# Patient Record
Sex: Female | Born: 1980 | Race: White | Hispanic: No | Marital: Married | Smoking: Never smoker
Health system: Southern US, Community
[De-identification: ages and names within clinical notes are randomized; demographics above are authoritative.]

## PROBLEM LIST (undated history)

## (undated) DIAGNOSIS — O09519 Supervision of elderly primigravida, unspecified trimester: Secondary | ICD-10-CM

## (undated) DIAGNOSIS — A609 Anogenital herpesviral infection, unspecified: Secondary | ICD-10-CM

## (undated) DIAGNOSIS — R87629 Unspecified abnormal cytological findings in specimens from vagina: Secondary | ICD-10-CM

## (undated) HISTORY — PX: WISDOM TOOTH EXTRACTION: SHX21

## (undated) HISTORY — PX: GYNECOLOGIC CRYOSURGERY: SHX857

---
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
## 2001-04-18 ENCOUNTER — Encounter: Payer: Self-pay | Admitting: Emergency Medicine

## 2001-04-18 ENCOUNTER — Emergency Department (HOSPITAL_COMMUNITY): Admission: EM | Admit: 2001-04-18 | Discharge: 2001-04-18 | Payer: Self-pay | Admitting: Emergency Medicine

## 2004-09-12 ENCOUNTER — Other Ambulatory Visit: Admission: RE | Admit: 2004-09-12 | Discharge: 2004-09-12 | Payer: Self-pay | Admitting: Obstetrics and Gynecology

## 2017-04-14 LAB — OB RESULTS CONSOLE RPR: RPR: NONREACTIVE

## 2017-04-14 LAB — OB RESULTS CONSOLE RUBELLA ANTIBODY, IGM: Rubella: IMMUNE

## 2017-04-14 LAB — OB RESULTS CONSOLE HEPATITIS B SURFACE ANTIGEN: HEP B S AG: NEGATIVE

## 2017-04-14 LAB — OB RESULTS CONSOLE ABO/RH: RH TYPE: POSITIVE

## 2017-04-14 LAB — OB RESULTS CONSOLE HIV ANTIBODY (ROUTINE TESTING): HIV: NONREACTIVE

## 2017-04-22 LAB — OB RESULTS CONSOLE GC/CHLAMYDIA
CHLAMYDIA, DNA PROBE: NEGATIVE
Gonorrhea: NEGATIVE

## 2017-11-01 ENCOUNTER — Encounter (HOSPITAL_COMMUNITY): Payer: Self-pay | Admitting: *Deleted

## 2017-11-02 ENCOUNTER — Other Ambulatory Visit (HOSPITAL_COMMUNITY): Payer: Self-pay | Admitting: Obstetrics and Gynecology

## 2017-11-02 ENCOUNTER — Ambulatory Visit (HOSPITAL_COMMUNITY)
Admission: RE | Admit: 2017-11-02 | Discharge: 2017-11-02 | Disposition: A | Discharge status: Home / Self Care | Payer: Managed Care, Other (non HMO) | Source: Ambulatory Visit | Attending: Obstetrics and Gynecology | Admitting: Obstetrics and Gynecology

## 2017-11-02 DIAGNOSIS — R609 Edema, unspecified: Secondary | ICD-10-CM | POA: Insufficient documentation

## 2017-11-02 NOTE — Progress Notes (Signed)
*  Preliminary Results* Right lower extremity venous duplex completed. Right lower extremity is negative for deep vein thrombosis. There is no evidence of right Baker's cyst.  11/02/2017 4:58 PM  Gertie FeyMichelle Daizy Morse, MHA, RVT, RDCS, RDMS

## 2017-11-08 ENCOUNTER — Encounter (HOSPITAL_COMMUNITY)
Admission: RE | Admit: 2017-11-08 | Discharge: 2017-11-08 | Disposition: A | Discharge status: Home / Self Care | Payer: Managed Care, Other (non HMO) | Source: Ambulatory Visit | Attending: Obstetrics and Gynecology | Admitting: Obstetrics and Gynecology

## 2017-11-08 HISTORY — DX: Supervision of elderly primigravida, unspecified trimester: O09.519

## 2017-11-08 HISTORY — DX: Unspecified abnormal cytological findings in specimens from vagina: R87.629

## 2017-11-08 HISTORY — DX: Anogenital herpesviral infection, unspecified: A60.9

## 2017-11-08 LAB — CBC
HCT: 36.4 % (ref 36.0–46.0)
Hemoglobin: 12.1 g/dL (ref 12.0–15.0)
MCH: 31.8 pg (ref 26.0–34.0)
MCHC: 33.2 g/dL (ref 30.0–36.0)
MCV: 95.5 fL (ref 78.0–100.0)
PLATELETS: 189 10*3/uL (ref 150–400)
RBC: 3.81 MIL/uL — AB (ref 3.87–5.11)
RDW: 14.3 % (ref 11.5–15.5)
WBC: 9.9 10*3/uL (ref 4.0–10.5)

## 2017-11-08 LAB — TYPE AND SCREEN
ABO/RH(D): O POS
Antibody Screen: NEGATIVE

## 2017-11-08 LAB — ABO/RH: ABO/RH(D): O POS

## 2017-11-08 NOTE — Patient Instructions (Signed)
Sydney Morse  11/08/2017   Your procedure is scheduled on:  11/09/2017  Enter through the [facility]tr[facility] at 1100 AM.  Pick up the phone at the desk and dial [phone]  Call this number if you have problems the morning of surgery:[phone]  Remember:   Do not eat food:(After Midnight) Desps de medianoche.  Do not drink clear liquids: (After Midnight) Desps de medianoche.  Take these medicines the morning of surgery with A SIP OF WATER: none   Do not wear jewelry, make-up or nail polish.  Do not wear lotions, powders, or perfumes. Do not wear deodorant.  Do not shave 48 hours prior to surgery.  Do not bring valuables to the hospital.  [name] is not   responsible for any belongings or valuables brought to the hospital.  Contacts, dentures or bridgework may not be worn into surgery.  Leave suitcase in the car. After surgery it may be brought to your room.  For patients admitted to the hospital, checkout time is 11:00 AM the day of              discharge.    N/A   Please read over the following fact sheets that you were given:   Surgical Site Infection Prevention

## 2017-11-09 ENCOUNTER — Inpatient Hospital Stay (HOSPITAL_COMMUNITY): Payer: Managed Care, Other (non HMO) | Admitting: Anesthesiology

## 2017-11-09 ENCOUNTER — Encounter (HOSPITAL_COMMUNITY): Payer: Self-pay | Admitting: *Deleted

## 2017-11-09 ENCOUNTER — Encounter (HOSPITAL_COMMUNITY)
Admission: AD | Disposition: A | Discharge status: Home / Self Care | Payer: Self-pay | Source: Home / Self Care | Attending: Obstetrics and Gynecology

## 2017-11-09 ENCOUNTER — Inpatient Hospital Stay (HOSPITAL_COMMUNITY)
Admission: AD | Admit: 2017-11-09 | Discharge: 2017-11-11 | DRG: 787 | Disposition: A | Discharge status: Home / Self Care | Payer: Managed Care, Other (non HMO) | Attending: Obstetrics and Gynecology | Admitting: Obstetrics and Gynecology

## 2017-11-09 ENCOUNTER — Inpatient Hospital Stay (HOSPITAL_COMMUNITY)
Admission: AD | Admit: 2017-11-09 | Disposition: A | Discharge status: Home / Self Care | Payer: Managed Care, Other (non HMO) | Source: Home / Self Care | Admitting: Obstetrics and Gynecology

## 2017-11-09 ENCOUNTER — Other Ambulatory Visit: Payer: Self-pay

## 2017-11-09 DIAGNOSIS — O9832 Other infections with a predominantly sexual mode of transmission complicating childbirth: Secondary | ICD-10-CM | POA: Diagnosis present

## 2017-11-09 DIAGNOSIS — A6000 Herpesviral infection of urogenital system, unspecified: Secondary | ICD-10-CM | POA: Diagnosis present

## 2017-11-09 DIAGNOSIS — O321XX0 Maternal care for breech presentation, not applicable or unspecified: Principal | ICD-10-CM | POA: Diagnosis present

## 2017-11-09 DIAGNOSIS — Z3A39 39 weeks gestation of pregnancy: Secondary | ICD-10-CM | POA: Diagnosis not present

## 2017-11-09 LAB — RPR: RPR Ser Ql: NONREACTIVE

## 2017-11-09 SURGERY — Surgical Case
Anesthesia: Spinal

## 2017-11-09 MED ORDER — PHENYLEPHRINE 8 MG IN D5W 100 ML (0.08MG/ML) PREMIX OPTIME
INJECTION | INTRAVENOUS | Status: AC
  Filled 2017-11-09: qty 100

## 2017-11-09 MED ORDER — DEXAMETHASONE SODIUM PHOSPHATE 4 MG/ML IJ SOLN
INTRAMUSCULAR | Status: AC
  Filled 2017-11-09: qty 1

## 2017-11-09 MED ORDER — ACETAMINOPHEN 10 MG/ML IV SOLN
1000.0000 mg | Freq: Once | INTRAVENOUS | Status: DC | PRN
  Administered 2017-11-09: 1000 mg via INTRAVENOUS

## 2017-11-09 MED ORDER — ONDANSETRON HCL 4 MG/2ML IJ SOLN
INTRAMUSCULAR | Status: DC | PRN
  Administered 2017-11-09: 4 mg via INTRAVENOUS

## 2017-11-09 MED ORDER — BUPIVACAINE IN DEXTROSE 0.75-8.25 % IT SOLN
INTRATHECAL | Status: DC | PRN
  Administered 2017-11-09: 1.6 mL via INTRATHECAL

## 2017-11-09 MED ORDER — ACETAMINOPHEN 10 MG/ML IV SOLN
INTRAVENOUS | Status: AC
  Filled 2017-11-09: qty 100

## 2017-11-09 MED ORDER — HYDROMORPHONE HCL 1 MG/ML IJ SOLN
0.2500 mg | INTRAMUSCULAR | Status: DC | PRN
  Administered 2017-11-09 (×2): 0.5 mg via INTRAVENOUS

## 2017-11-09 MED ORDER — ONDANSETRON HCL 4 MG/2ML IJ SOLN
INTRAMUSCULAR | Status: AC
  Filled 2017-11-09: qty 2

## 2017-11-09 MED ORDER — OXYTOCIN 40 UNITS IN LACTATED RINGERS INFUSION - SIMPLE MED: 2.5000 [IU]/h | INTRAVENOUS | Status: AC

## 2017-11-09 MED ORDER — DIPHENHYDRAMINE HCL 25 MG PO CAPS: 25.0000 mg | ORAL_CAPSULE | Freq: Four times a day (QID) | ORAL | Status: DC | PRN

## 2017-11-09 MED ORDER — MENTHOL 3 MG MT LOZG: 1.0000 | LOZENGE | OROMUCOSAL | Status: DC | PRN

## 2017-11-09 MED ORDER — PROMETHAZINE HCL 25 MG/ML IJ SOLN: 6.2500 mg | INTRAMUSCULAR | Status: DC | PRN

## 2017-11-09 MED ORDER — MEASLES, MUMPS & RUBELLA VAC ~~LOC~~ INJ: 0.5000 mL | INJECTION | Freq: Once | SUBCUTANEOUS | Status: DC

## 2017-11-09 MED ORDER — DIBUCAINE 1 % RE OINT: 1.0000 "application " | TOPICAL_OINTMENT | RECTAL | Status: DC | PRN

## 2017-11-09 MED ORDER — SIMETHICONE 80 MG PO CHEW
80.0000 mg | CHEWABLE_TABLET | ORAL | Status: DC
  Administered 2017-11-09 – 2017-11-11 (×2): 80 mg via ORAL
  Filled 2017-11-09 (×2): qty 1

## 2017-11-09 MED ORDER — WITCH HAZEL-GLYCERIN EX PADS: 1.0000 "application " | MEDICATED_PAD | CUTANEOUS | Status: DC | PRN

## 2017-11-09 MED ORDER — LACTATED RINGERS IV SOLN
INTRAVENOUS | Status: DC | PRN
  Administered 2017-11-09: 10:00:00 via INTRAVENOUS

## 2017-11-09 MED ORDER — ACETAMINOPHEN 325 MG PO TABS
650.0000 mg | ORAL_TABLET | ORAL | Status: DC | PRN
  Administered 2017-11-09 – 2017-11-10 (×3): 650 mg via ORAL
  Filled 2017-11-09 (×4): qty 2

## 2017-11-09 MED ORDER — DEXAMETHASONE SODIUM PHOSPHATE 4 MG/ML IJ SOLN
INTRAMUSCULAR | Status: DC | PRN
  Administered 2017-11-09: 4 mg via INTRAVENOUS

## 2017-11-09 MED ORDER — HYDROMORPHONE HCL 1 MG/ML IJ SOLN
1.0000 mg | INTRAMUSCULAR | Status: DC | PRN
  Administered 2017-11-09: 1 mg via INTRAVENOUS
  Filled 2017-11-09: qty 1

## 2017-11-09 MED ORDER — HYDROMORPHONE HCL 1 MG/ML IJ SOLN
INTRAMUSCULAR | Status: AC
  Filled 2017-11-09: qty 0.5

## 2017-11-09 MED ORDER — PHENYLEPHRINE 40 MCG/ML (10ML) SYRINGE FOR IV PUSH (FOR BLOOD PRESSURE SUPPORT)
PREFILLED_SYRINGE | INTRAVENOUS | Status: AC
  Filled 2017-11-09: qty 10

## 2017-11-09 MED ORDER — MEPERIDINE HCL 25 MG/ML IJ SOLN
INTRAMUSCULAR | Status: AC
  Filled 2017-11-09: qty 1

## 2017-11-09 MED ORDER — PHENYLEPHRINE 8 MG IN D5W 100 ML (0.08MG/ML) PREMIX OPTIME
INJECTION | INTRAVENOUS | Status: DC | PRN
  Administered 2017-11-09: 60 ug/min via INTRAVENOUS

## 2017-11-09 MED ORDER — SOD CITRATE-CITRIC ACID 500-334 MG/5ML PO SOLN
30.0000 mL | ORAL | Status: AC
  Administered 2017-11-09: 30 mL via ORAL
  Filled 2017-11-09: qty 15

## 2017-11-09 MED ORDER — SIMETHICONE 80 MG PO CHEW
80.0000 mg | CHEWABLE_TABLET | Freq: Three times a day (TID) | ORAL | Status: DC
  Administered 2017-11-10 (×3): 80 mg via ORAL
  Filled 2017-11-09 (×3): qty 1

## 2017-11-09 MED ORDER — PRENATAL MULTIVITAMIN CH
1.0000 | ORAL_TABLET | Freq: Every day | ORAL | Status: DC
  Administered 2017-11-10: 1 via ORAL
  Filled 2017-11-09: qty 1

## 2017-11-09 MED ORDER — SIMETHICONE 80 MG PO CHEW: 80.0000 mg | CHEWABLE_TABLET | ORAL | Status: DC | PRN

## 2017-11-09 MED ORDER — ZOLPIDEM TARTRATE 5 MG PO TABS: 5.0000 mg | ORAL_TABLET | Freq: Every evening | ORAL | Status: DC | PRN

## 2017-11-09 MED ORDER — TETANUS-DIPHTH-ACELL PERTUSSIS 5-2.5-18.5 LF-MCG/0.5 IM SUSP: 0.5000 mL | Freq: Once | INTRAMUSCULAR | Status: DC

## 2017-11-09 MED ORDER — SODIUM CHLORIDE 0.9 % IV SOLN
2.0000 g | INTRAVENOUS | Status: AC
  Administered 2017-11-09: 2 g via INTRAVENOUS
  Filled 2017-11-09: qty 2

## 2017-11-09 MED ORDER — COCONUT OIL OIL
1.0000 "application " | TOPICAL_OIL | Status: DC | PRN
  Administered 2017-11-10: 1 via TOPICAL
  Filled 2017-11-09: qty 120

## 2017-11-09 MED ORDER — LACTATED RINGERS IV SOLN
INTRAVENOUS | Status: DC
  Administered 2017-11-09 (×3): via INTRAVENOUS

## 2017-11-09 MED ORDER — LACTATED RINGERS IV SOLN
INTRAVENOUS | Status: DC
  Administered 2017-11-09: 15:00:00 via INTRAVENOUS

## 2017-11-09 MED ORDER — OXYTOCIN 10 UNIT/ML IJ SOLN
INTRAVENOUS | Status: DC | PRN
  Administered 2017-11-09: 40 [IU] via INTRAVENOUS

## 2017-11-09 MED ORDER — CEFAZOLIN SODIUM-DEXTROSE 2-4 GM/100ML-% IV SOLN: 2.0000 g | INTRAVENOUS | Status: DC

## 2017-11-09 MED ORDER — IBUPROFEN 600 MG PO TABS
600.0000 mg | ORAL_TABLET | Freq: Four times a day (QID) | ORAL | Status: DC
  Administered 2017-11-09 – 2017-11-11 (×7): 600 mg via ORAL
  Filled 2017-11-09 (×7): qty 1

## 2017-11-09 MED ORDER — HYDROCODONE-ACETAMINOPHEN 7.5-325 MG PO TABS: 1.0000 | ORAL_TABLET | Freq: Once | ORAL | Status: DC | PRN

## 2017-11-09 MED ORDER — SENNOSIDES-DOCUSATE SODIUM 8.6-50 MG PO TABS
2.0000 | ORAL_TABLET | ORAL | Status: DC
  Administered 2017-11-09 – 2017-11-11 (×2): 2 via ORAL
  Filled 2017-11-09 (×2): qty 2

## 2017-11-09 MED ORDER — OXYCODONE HCL 5 MG PO TABS: 5.0000 mg | ORAL_TABLET | ORAL | Status: DC | PRN

## 2017-11-09 MED ORDER — OXYCODONE HCL 5 MG PO TABS: 10.0000 mg | ORAL_TABLET | ORAL | Status: DC | PRN

## 2017-11-09 MED ORDER — MEPERIDINE HCL 25 MG/ML IJ SOLN
6.2500 mg | INTRAMUSCULAR | Status: DC | PRN
  Administered 2017-11-09: 6.25 mg via INTRAVENOUS

## 2017-11-09 MED ORDER — OXYTOCIN 10 UNIT/ML IJ SOLN
INTRAMUSCULAR | Status: AC
  Filled 2017-11-09: qty 4

## 2017-11-09 SURGICAL SUPPLY — 38 items
ADH SKN CLS APL DERMABOND .7 (GAUZE/BANDAGES/DRESSINGS)
APL SKNCLS STERI-STRIP NONHPOA (GAUZE/BANDAGES/DRESSINGS) ×1
BENZOIN TINCTURE PRP APPL 2/3 (GAUZE/BANDAGES/DRESSINGS) ×2 IMPLANT
CHLORAPREP W/TINT 26ML (MISCELLANEOUS) ×3 IMPLANT
CLAMP CORD UMBIL (MISCELLANEOUS) IMPLANT
CLOSURE STERI-STRIP 1/2X4 (GAUZE/BANDAGES/DRESSINGS) ×1
CLOSURE WOUND 1/2 X4 (GAUZE/BANDAGES/DRESSINGS)
CLOTH BEACON ORANGE TIMEOUT ST (SAFETY) ×3 IMPLANT
CLSR STERI-STRIP ANTIMIC 1/2X4 (GAUZE/BANDAGES/DRESSINGS) ×1 IMPLANT
DERMABOND ADVANCED (GAUZE/BANDAGES/DRESSINGS)
DERMABOND ADVANCED .7 DNX12 (GAUZE/BANDAGES/DRESSINGS) IMPLANT
DRSG OPSITE POSTOP 4X10 (GAUZE/BANDAGES/DRESSINGS) ×3 IMPLANT
ELECT REM PT RETURN 9FT ADLT (ELECTROSURGICAL) ×3
ELECTRODE REM PT RTRN 9FT ADLT (ELECTROSURGICAL) ×1 IMPLANT
EXTRACTOR VACUUM M CUP 4 TUBE (SUCTIONS) IMPLANT
EXTRACTOR VACUUM M CUP 4' TUBE (SUCTIONS)
GLOVE BIO SURGEON STRL SZ7.5 (GLOVE) ×3 IMPLANT
GLOVE BIOGEL PI IND STRL 7.0 (GLOVE) ×1 IMPLANT
GLOVE BIOGEL PI INDICATOR 7.0 (GLOVE) ×2
GOWN STRL REUS W/TWL LRG LVL3 (GOWN DISPOSABLE) ×6 IMPLANT
KIT ABG SYR 3ML LUER SLIP (SYRINGE) ×3 IMPLANT
NDL HYPO 25X5/8 SAFETYGLIDE (NEEDLE) ×1 IMPLANT
NEEDLE HYPO 25X5/8 SAFETYGLIDE (NEEDLE) ×3 IMPLANT
NS IRRIG 1000ML POUR BTL (IV SOLUTION) ×3 IMPLANT
PACK C SECTION WH (CUSTOM PROCEDURE TRAY) ×3 IMPLANT
PAD OB MATERNITY 4.3X12.25 (PERSONAL CARE ITEMS) ×3 IMPLANT
PENCIL SMOKE EVAC W/HOLSTER (ELECTROSURGICAL) ×3 IMPLANT
STRIP CLOSURE SKIN 1/2X4 (GAUZE/BANDAGES/DRESSINGS) IMPLANT
SUT MNCRL 0 VIOLET CTX 36 (SUTURE) ×4 IMPLANT
SUT MONOCRYL 0 CTX 36 (SUTURE) ×8
SUT PDS AB 0 CTX 60 (SUTURE) ×3 IMPLANT
SUT PLAIN 0 NONE (SUTURE) IMPLANT
SUT PLAIN 2 0 (SUTURE)
SUT PLAIN 2 0 XLH (SUTURE) IMPLANT
SUT PLAIN ABS 2-0 CT1 27XMFL (SUTURE) IMPLANT
SUT VIC AB 4-0 KS 27 (SUTURE) ×3 IMPLANT
TOWEL OR 17X24 6PK STRL BLUE (TOWEL DISPOSABLE) ×3 IMPLANT
TRAY FOLEY W/BAG SLVR 14FR LF (SET/KITS/TRAYS/PACK) ×3 IMPLANT

## 2017-11-09 NOTE — Transfer of Care (Signed)
Immediate Anesthesia Transfer of Care Note  Patient: Sydney Morse  Procedure(s) Performed: CESAREAN SECTION (N/A )  Patient Location: PACU  Anesthesia Type:Spinal  Level of Consciousness: awake, alert  and oriented  Airway & Oxygen Therapy: Patient Spontanous Breathing  Post-op Assessment: Report given to RN  Post vital signs: Reviewed and stable  Last Vitals:  Vitals Value Taken Time  BP 95/64 11/09/2017 10:43 AM  Temp    Pulse 129 11/09/2017 10:44 AM  Resp 14 11/09/2017 10:44 AM  SpO2 100 % 11/09/2017 10:44 AM  Vitals shown include unvalidated device data.  Last Pain: There were no vitals filed for this visit.       Complications: No apparent anesthesia complications

## 2017-11-09 NOTE — Anesthesia Preprocedure Evaluation (Addendum)
Anesthesia Evaluation  Patient identified by MRN, date of birth, ID band Patient awake    Reviewed: Allergy & Precautions, NPO status , Patient's Chart, lab work & pertinent test results  Airway Mallampati: II  TM Distance: >3 FB Neck ROM: Full    Dental no notable dental hx. (+) Teeth Intact   Pulmonary neg pulmonary ROS,    Pulmonary exam normal breath sounds clear to auscultation       Cardiovascular negative cardio ROS Normal cardiovascular exam Rhythm:Regular Rate:Normal     Neuro/Psych negative neurological ROS     GI/Hepatic negative GI ROS, Neg liver ROS,   Endo/Other  negative endocrine ROS  Renal/GU negative Renal ROS     Musculoskeletal   Abdominal   Peds  Hematology negative hematology ROS (+)   Anesthesia Other Findings   Reproductive/Obstetrics (+) Pregnancy                             Anesthesia Physical Anesthesia Plan  ASA: II  Anesthesia Plan: Spinal   Post-op Pain Management:    Induction:   PONV Risk Score and Plan:   Airway Management Planned: Natural Airway and Nasal Cannula  Additional Equipment:   Intra-op Plan:   Post-operative Plan:   Informed Consent: I have reviewed the patients History and Physical, chart, labs and discussed the procedure including the risks, benefits and alternatives for the proposed anesthesia with the patient or authorized representative who has indicated his/her understanding and acceptance.     Plan Discussed with:   Anesthesia Plan Comments:         Anesthesia Quick Evaluation

## 2017-11-09 NOTE — Anesthesia Postprocedure Evaluation (Signed)
Anesthesia Post Note  Patient: Sydney Morse  Procedure(s) Performed: CESAREAN SECTION (N/A )     Patient location during evaluation: Mother Baby Anesthesia Type: Spinal Level of consciousness: awake and alert and oriented Pain management: satisfactory to patient Vital Signs Assessment: post-procedure vital signs reviewed and stable Respiratory status: spontaneous breathing and nonlabored ventilation Cardiovascular status: stable Postop Assessment: no headache, no backache, patient able to bend at knees, no signs of nausea or vomiting and adequate PO intake Anesthetic complications: no    Last Vitals:  Vitals:   11/09/17 1356 11/09/17 1459  BP: 130/87 118/73  Pulse: 72 66  Resp: 20 18  Temp: 36.6 C 36.9 C  SpO2: 100% 97%    Last Pain:  Vitals:   11/09/17 1600  TempSrc:   PainSc: 0-No pain   Pain Goal: Patients Stated Pain Goal: 3 (11/09/17 1456)               Madison HickmanGREGORY,[name]

## 2017-11-09 NOTE — Anesthesia Postprocedure Evaluation (Signed)
Anesthesia Post Note  Patient: Sydney Morse  Procedure(s) Performed: CESAREAN SECTION (N/A )     Patient location during evaluation: PACU Anesthesia Type: Spinal Level of consciousness: oriented and awake and alert Pain management: pain level controlled Vital Signs Assessment: post-procedure vital signs reviewed and stable Respiratory status: spontaneous breathing, respiratory function stable and patient connected to nasal cannula oxygen Cardiovascular status: blood pressure returned to baseline and stable Postop Assessment: no headache, no backache and no apparent nausea or vomiting Anesthetic complications: no    Last Vitals:  Vitals:   11/09/17 1045 11/09/17 1115  BP: (!) 150/132 103/71  Pulse: (!) 129 (!) 131  Resp: 14 16  Temp:    SpO2: 100% 100%    Last Pain:  Vitals:   11/09/17 1043  TempSrc: Oral  PainSc: 0-No pain   Pain Goal:                 Sydney Morse

## 2017-11-09 NOTE — MAU Note (Signed)
Pt presents to MAU with complaints of "water broke" at 0730 this morning. Denies any contractions at this time

## 2017-11-09 NOTE — Op Note (Signed)
Cesarean Section Procedure Note  Pre-operative Diagnosis: IUP at 39 weeks, breech presentation, SROM and labor  Post-operative Diagnosis: same  Surgeon: Turner DanielsLOWE,Yasmina Chico C   Assistants: Herbert SetaHeather, RNFA  Anesthesia: spinal  Procedure:  Low Segment Transverse cesarean section  Procedure Details  The patient was seen in the Holding Room. The risks, benefits, complications, treatment options, and expected outcomes were discussed with the patient.  The patient concurred with the proposed plan, giving informed consent.  The site of surgery properly noted/marked.. A Time Out was held and the above information confirmed.  After induction of anesthesia, the patient was draped and prepped in the usual sterile manner. A Pfannenstiel incision was made and carried down through the subcutaneous tissue to the fascia. Fascial incision was made and extended transversely. The fascia was separated from the underlying rectus tissue superiorly and inferiorly. The peritoneum was identified and entered. Peritoneal incision was extended longitudinally. The utero-vesical peritoneal reflection was incised transversely and the bladder flap was bluntly freed from the lower uterine segment. A low transverse uterine incision was made. Delivered from frank breech presentation was a baby with Apgar scores of 9 at one minute and 9 at five minutes. After the umbilical cord was clamped and cut cord blood was obtained for evaluation. The placenta was removed intact and appeared normal. The uterine outline, tubes and ovaries appeared normal. The uterine incision was closed with running locked sutures of 0 monocryl and imbricated with 0 monocryl. Hemostasis was observed. Lavage was carried out until clear. The peritoneum was then closed with 0 monocryl and rectus muscles plicated in the midline.  After hemostasis was assured, the fascia was then reapproximated with running sutures of 0 Vicryl. Irrigation was applied and after adequate  hemostasis was assured, the skin was reapproximated with subcutaneous sutures using 4-0 monocryl.  Instrument, sponge, and needle counts were correct prior the abdominal closure and at the conclusion of the case. The patient received 2 grams cefotetan preoperatively.  Findings: Viable female  Estimated Blood Loss:  500cc         Specimens: Placenta was sent to labor and delivery         Complications:  None

## 2017-11-09 NOTE — MAU Provider Note (Signed)
S: Ms. Sydney CarwinDani Spahr is a 37 y.o. G3P0020 at 4747w3d  who presents to MAU today complaining of leaking of fluid since 0730. She denies vaginal bleeding. She denies contractions. She reports normal fetal movement.    O: BP 129/89   Pulse 88   Temp 98.6 F (37 C)   Resp 16   LMP 02/06/2017  GENERAL: Well-developed, well-nourished female in no acute distress.  HEAD: Normocephalic, atraumatic.  CHEST: Normal effort of breathing, regular heart rate ABDOMEN: Soft, nontender, gravid  Cervical exam by RN:  Dilation: 3 Effacement (%): 90 Presentation: Complete Breech Exam by:: ginger morris rn   Fetal Monitoring: Baseline: 130 bpm Variability: moderate Accelerations: 15 x 15 Decelerations:  none Contractions: q 2-3 minutes, mild  Results for orders placed or performed during the hospital encounter of 11/08/17 (from the past 24 hour(s))  CBC     Status: Abnormal   Collection Time: 11/08/17 11:25 AM  Result Value Ref Range   WBC 9.9 4.0 - 10.5 K/uL   RBC 3.81 (L) 3.87 - 5.11 MIL/uL   Hemoglobin 12.1 12.0 - 15.0 g/dL   HCT 16.136.4 09.636.0 - 04.546.0 %   MCV 95.5 78.0 - 100.0 fL   MCH 31.8 26.0 - 34.0 pg   MCHC 33.2 30.0 - 36.0 g/dL   RDW 40.914.3 81.111.5 - 91.415.5 %   Platelets 189 150 - 400 K/uL  RPR     Status: None   Collection Time: 11/08/17 11:25 AM  Result Value Ref Range   RPR Ser Ql Non Reactive Non Reactive  Type and screen [facility]     Status: None   Collection Time: 11/08/17 11:25 AM  Result Value Ref Range   ABO/RH(D) O POS    Antibody Screen NEG    Sample Expiration      11/11/2017 Performed at [facility], [address] [place], [place] [address]   ABO/Rh     Status: None   Collection Time: 11/08/17 11:25 AM  Result Value Ref Range   ABO/RH(D)      O POS Performed at [facility], [address] [place], [place] [address]    Procedure:  Pt informed that the ultrasound is considered a limited OB ultrasound and is not intended to be a  complete ultrasound exam.  Patient also informed that the ultrasound is not being completed with the intent of assessing for fetal or placental anomalies or any pelvic abnormalities.  Explained that the purpose of today's ultrasound is to assess for  presentation.  Patient acknowledges the purpose of the exam and the limitations of the study.  Breech presentation confirmed.    A: SIUP at 3947w3d  SROM  Breech presentation   P: Patient to OR with Private MD, RN to call report  Marny LowensteinWenzel, Srikar Chiang N, PA-C 11/09/2017 8:30 AM

## 2017-11-09 NOTE — Anesthesia Procedure Notes (Signed)
Spinal  Patient location during procedure: OB Start time: 11/09/2017 9:46 AM End time: 11/09/2017 9:50 AM Staffing Anesthesiologist: Trevor IhaHouser, Khyla Mccumbers A, MD Performed: anesthesiologist  Preanesthetic Checklist Completed: patient identified, surgical consent, pre-op evaluation, timeout performed, IV checked, risks and benefits discussed and monitors and equipment checked Spinal Block Patient position: sitting Prep: site prepped and draped and DuraPrep Patient monitoring: heart rate, cardiac monitor, continuous pulse ox and blood pressure Approach: midline Location: L3-4 Injection technique: single-shot Needle Needle type: Pencan  Needle gauge: 24 G Needle length: 10 cm Needle insertion depth: 6 cm Assessment Sensory level: T4 Additional Notes 1 attempt . Pt tolerated procedure well.

## 2017-11-09 NOTE — Addendum Note (Signed)
Addendum  created 11/09/17 1631 by Shanon PayorGregory, Lesia Monica M, CRNA   Sign clinical note

## 2017-11-09 NOTE — H&P (Addendum)
Charise CarwinDani Kozinski is a 37 y.o. female presenting for cesarean section for breech. 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 AMA>NIPT normal. C/O swelling of right leg>negative Doppler. OB History    Gravida  3   Para      Term      Preterm      AB  2   Living        SAB  2   TAB      Ectopic      Multiple      Live Births             Past Medical History:  Diagnosis Date  . AMA (advanced maternal age) primigravida 36+   . HSV (herpes simplex virus) anogenital infection   . Vaginal Pap smear, abnormal    Past Surgical History:  Procedure Laterality Date  . GYNECOLOGIC CRYOSURGERY    . WISDOM TOOTH EXTRACTION     Family History: family history includes Breast cancer in her maternal aunt; Diabetes in her brother; Hypertension in her maternal grandmother and mother; Lung cancer in her maternal grandfather. Social History:  reports that she has never smoked. She has never used smokeless tobacco. She reports that she drank alcohol. She reports that she does not use drugs.     Maternal Diabetes: No Genetic Screening: Normal Maternal Ultrasounds/Referrals: Normal Fetal Ultrasounds or other Referrals:  None Maternal Substance Abuse:  No Significant Maternal Medications:  None Significant Maternal Lab Results:  None Other Comments:  None  Review of Systems  Eyes: Negative for blurred vision.  Gastrointestinal: Negative for abdominal pain.  Neurological: Negative for headaches.   History Dilation: 3 Effacement (%): 90 Exam by:: ginger morris rn Blood pressure 129/89, pulse 88, temperature 98.6 F (37 C), resp. rate 16, last menstrual period 02/06/2017. Maternal Exam:  Abdomen: Fetal presentation: breech     Physical Exam  Cardiovascular: Normal rate and regular rhythm.  Respiratory: Effort normal and breath sounds normal.  GI: Soft. There is no tenderness.  Neurological: She has normal reflexes.    Prenatal  labs: ABO, Rh: --/--/O POS, O POS Performed at [facility], [address] [place], [place] [address]  [phone](08/12 1125) Antibody: NEG (08/12 1125) Rubella: Immune (01/16 0000) RPR: Non Reactive (08/12 1125)  HBsAg: Negative (01/16 0000)  HIV: Non-reactive (01/16 0000)  GBS:   negative  Assessment/Plan: 37 yo G3P0 with breech for cesarean section D/W patient procedure and risks including infection, organ damage, bleeding/transfusion-HIV/Hep, DVT/PE, pneumonia.   Guy SandiferJames E Tomblin II 11/09/2017, 9:00 AM   Pt arrived in MAU with SROM and labor sxs.  Plan to proceed with Primary C/S Risks and benefits of C/S were discussed.  All questions were answered and informed consent was obtained.  Plan to proceed with low segment transverse Cesarean Section. 11/09/17 0925 DL

## 2017-11-10 LAB — CBC
HCT: 26.5 % — ABNORMAL LOW (ref 36.0–46.0)
HEMOGLOBIN: 8.9 g/dL — AB (ref 12.0–15.0)
MCH: 31.7 pg (ref 26.0–34.0)
MCHC: 33.6 g/dL (ref 30.0–36.0)
MCV: 94.3 fL (ref 78.0–100.0)
PLATELETS: 182 10*3/uL (ref 150–400)
RBC: 2.81 MIL/uL — AB (ref 3.87–5.11)
RDW: 14.2 % (ref 11.5–15.5)
WBC: 14.8 10*3/uL — AB (ref 4.0–10.5)

## 2017-11-10 LAB — BIRTH TISSUE RECOVERY COLLECTION (PLACENTA DONATION)

## 2017-11-10 MED ORDER — FERROUS SULFATE 325 (65 FE) MG PO TABS: 325.0000 mg | ORAL_TABLET | Freq: Every day | ORAL | Status: DC

## 2017-11-10 NOTE — Lactation Note (Signed)
This note was copied from a baby's chart. Lactation Consultation Note  Patient Name: Sydney Morse [id number]'s Date: 11/10/2017 Reason for consult: Follow-up assessment;Difficult latch;Nipple pain/trauma Baby is sleeping and mom does not want to disturb her.  She is using a 16 mm nipple shield but uncomfortable with feedings.  Mom states she is not opening wide enough.  Instructed to call out for assist when baby wakes.  Maternal Data    Feeding Feeding Type: Breast Fed  LATCH Score Latch: Repeated attempts needed to sustain latch, nipple held in mouth throughout feeding, stimulation needed to elicit sucking reflex.  Audible Swallowing: A few with stimulation  Type of Nipple: Everted at rest and after stimulation  Comfort (Breast/Nipple): Filling, red/small blisters or bruises, mild/mod discomfort  Hold (Positioning): Assistance needed to correctly position infant at breast and maintain latch.  LATCH Score: 6  Interventions    Lactation Tools Discussed/Used Tools: Nipple Shields Nipple shield size: 16 Breast pump type: Manual   Consult Status Consult Status: Follow-up Date: 11/11/17 Follow-up type: In-patient    Huston FoleyMOULDEN, Toshiye Kever S 11/10/2017, 1:27 PM

## 2017-11-10 NOTE — Progress Notes (Signed)
Subjective: Postpartum Day 1: Cesarean Delivery Patient reports tolerating PO and + flatus.    Objective: Vital signs in last 24 hours: Temp:  [97.8 F (36.6 C)-99 F (37.2 C)] 97.8 F (36.6 C) (08/14 0156) Pulse Rate:  [66-131] 75 (08/14 0536) Resp:  [9-25] 16 (08/14 0536) BP: (95-150)/(57-132) 107/59 (08/14 0536) SpO2:  [97 %-100 %] 100 % (08/14 0536) Weight:  [74 kg] 74 kg (08/13 1403)  Physical Exam:  General: alert, cooperative and no distress Lochia: appropriate Uterine Fundus: firm Incision: healing well DVT Evaluation: No evidence of DVT seen on physical exam.  Recent Labs    11/08/17 1125 11/10/17 0512  HGB 12.1 8.9*  HCT 36.4 26.5*    Assessment/Plan: Status post Cesarean section. Doing well postoperatively.  Continue current care.  Roselle LocusJames E Kendrick Haapala II 11/10/2017, 9:28 AM

## 2017-11-10 NOTE — Lactation Note (Signed)
This note was copied from a baby's chart. Lactation Consultation Note  Patient Name: Sydney Morse [id number]'s Date: 11/10/2017 Reason for consult: Follow-up assessment;Difficult latch;Infant weight loss;Nipple pain/trauma  32 hours old FT female who is being exclusively BF by her mother, she's a P2. Mom requested assistance because she kept having nipple pain. Baby was finishing nursing when entering the room, he had fallen asleep at the breast, mom voiced feeding were uncomfortable/painful, reviewed treatment for sore nipples. Noticed a small smear on colostrum on NS; mom kept saying she hasn't got any milk yet, LC offered assistance with hand expression and unable to get any colostrum at this point.  Noticed her nipples are asymmetrical, the NS# 16 fits the left one but it seems to be a little bit snugger on the right one; not tight though. Asked mom how does she feel about BF and pumping (she hasn't pumped yet) and she said she'd do it if she can get to feed her baby because "nothing is coming out". [facility] and no colostrum was observed yet. Showed mom how to use coconut oil prior pumping. Discussed with parents lactogenesis II and cluster feeding, they're anxious about baby being hungry and not able to offer any milk. Reassured parents that we'll supplement baby when medically necessary according to protocol since mom does plan to BF the instrument for supplementation will be a curve tip syringe.   Baby will start supplementation with Similac 19 calorie formula on the next feeding following guidelines according to baby's age, RN Selena BattenKim will be bringing it to the room with a curve tip syringe. Parents understand that this is just a temporary fix and BF is the long term goal, mom is committed to pump at least 6 times/24 hours in order to keep up with induction of lactation. Mom may take a break from BF until her nipples are not sore anymore (no visible damage, she's probably  experiencing transient soreness) but baby has a very strong suck; noticed that he did some biting when doing suck training; also, tongue will not extend past the gumline. Asked parents to double check any restrictions on baby's mouth with baby's pediatrician on the next visit.  Encouraged mom to pump at least 6 times/24 hours or sooner and to feed baby with any amount of her EBM she may get first, before offering the formula. Feeding plan needs to be revised tomorrow morning, mom will start putting baby to the breast again 8-12 times/24 hours or sooner when feeding cues are present. Both parents aware of LC services and will call PRN.  Maternal Data    Feeding Feeding Type: Formula   Interventions Interventions: Breast feeding basics reviewed;Assisted with latch;Hand express;Breast massage;Skin to skin;Breast compression;Adjust position;Support pillows;Coconut oil;DEBP  Lactation Tools Discussed/Used Tools: Pump Breast pump type: Double-Electric Breast Pump Pump Review: Setup, frequency, and cleaning;Milk Storage Initiated by:: Sydney Morse  Date initiated:: 11/10/17   Consult Status Consult Status: Follow-up Date: 11/11/17 Follow-up type: In-patient    Takisha Pelle Venetia ConstableS Kess Mcilwain 11/10/2017, 6:52 PM

## 2017-11-10 NOTE — Lactation Note (Signed)
This note was copied from a baby's chart. Lactation Consultation Note Baby 21 hrs old. Mom BF to Lt. Breast in cradle position.  Mom has very short shaft nipple. Mom states nipples usually stick out more than they do now. Mom generalized edema.  Hand expression demonstrated. No colostrum noted.  Round breast are tight w/small areola and nipples. Slightly compressible but not enough to obtain deep latch. Baby is latched w/shallow latch. Nipple can not go deep into mouth. Fitted mom w/#16 NS.   Baby has thick upper labial frenulum. Difficulty getting top lip to flange. Tongue appear to be tight, curls on sides. Chin tug needed and then doesn't open wide. Baby fussy, passing gas and burping. Attempted to burp several time.  Assisted in football position, didn't feed well, very fussy. Attempted in cross cradle, difficult for mom. Cradle position ok. Baby suckled only a few times. Mainly held in mouth, slept. Placed baby in bed.  Mom encouraged to feed baby 8-12 times/24 hours and with feeding cues.  Newborn behavior, STS, I&O, supply and demand discussed. D/t needing NS LC took DEBP, shells, hand pump into r,. Hand pump and shells discussed. FOB at bedside supportive and helpful.  Mom will need more assistance. Encouraged to call. [place] brochure given w/resources, support groups and [facility] services.  Patient Name: Sydney Morse [id number] Date: 11/10/2017 Reason for consult: Initial assessment;1st time breastfeeding   Maternal Data Has patient been taught Hand Expression?: Yes Does the patient have breastfeeding experience prior to this delivery?: No  Feeding Feeding Type: Breast Fed Length of feed: 25 min  LATCH Score Latch: Repeated attempts needed to sustain latch, nipple held in mouth throughout feeding, stimulation needed to elicit sucking reflex.  Audible Swallowing: None  Type of Nipple: Everted at rest and after stimulation(very short shaft)  Comfort (Breast/Nipple): Filling,  red/small blisters or bruises, mild/mod discomfort(Lt. nipple sore. intact)  Hold (Positioning): Full assist, staff holds infant at breast  LATCH Score: 4  Interventions Interventions: Breast feeding basics reviewed;Support pillows;Assisted with latch;Position options;Skin to skin;Breast massage;Hand express;Shells;Pre-pump if needed;Reverse pressure;Hand pump;Breast compression;DEBP;Adjust position  Lactation Tools Discussed/Used Tools: Shells;Pump;Nipple Shields Nipple shield size: 16 Shell Type: Inverted Breast pump type: Double-Electric Breast Pump;Manual WIC Program: No Pump Review: Milk Storage(will ask RN to set up. LC took kit and pump into rm.)   Consult Status Consult Status: Follow-up Date: 11/10/17 Follow-up type: In-patient    Theodoro Kalata 11/10/2017, 4:26 AM

## 2017-11-11 MED ORDER — IBUPROFEN 600 MG PO TABS: 600.0000 mg | ORAL_TABLET | Freq: Four times a day (QID) | ORAL | 0 refills | Status: AC

## 2017-11-11 NOTE — Lactation Note (Signed)
This note was copied from a baby's chart. Lactation Consultation Note:  Mother has been syringe feeding with formula.  Mother reports that she attempt to breastfeed infant yesterday.   Advised mother to do frequent STS and offer breast with all feeding cues , And at least 8-12 times in 24 hours.  Discussed mother breastfeeding goals.  She reports that she wants to breast feed.  Offered assistance with latching infant. Infant had been fed several times with formula. She was not hungry . Several attempts to latch and infant began rooting.  #5 fr feeding tube sat up and infant took approx 5 ml with a few good strong sucks.   Advised mother to continue to supplement infant with ebm and formula according to guidelines.  Discussed methods of supplementing and paced bottle feeding.   Mother advised to post pump every 2-3 hours for at least 15-20 mins.  Encouraged to hand express frequently before and after feeding.  Discussed cluster feeding . Advised [facility] to assess feedings.   Mother advised in treatment and prevention of engorgement .  Parents receptive to all teaching.   Patient Name: Sydney Charise CarwinDani Sweeten ZOXWR'UToday's Date: 11/11/2017 Reason for consult: Follow-up assessment   Maternal Data    Feeding Feeding Type: Breast Fed  LATCH Score                   Interventions Interventions: Assisted with latch;Skin to skin;Breast massage;Hand express;Breast compression;Position options;Expressed milk;Comfort gels  Lactation Tools Discussed/Used     Consult Status Consult Status: Complete Follow-up type: Out-patient(follow up with Jimmye NormanBeth Sanders RN,IBCLC)    Stevan BornKendrick, Ellene Bloodsaw McCoy 11/11/2017, 11:29 AM

## 2017-11-11 NOTE — Discharge Summary (Signed)
Obstetric Discharge Summary Reason for Admission: cesarean section, BREECH Prenatal Procedures: none Intrapartum Procedures: cesarean: low cervical, transverse Postpartum Procedures: none Complications-Operative and Postpartum: none Hemoglobin  Date Value Ref Range Status  11/10/2017 8.9 (L) 12.0 - 15.0 g/dL Final    Comment:    DELTA CHECK NOTED REPEATED TO VERIFY    HCT  Date Value Ref Range Status  11/10/2017 26.5 (L) 36.0 - 46.0 % Final    Physical Exam:  General: alert Lochia: appropriate Uterine Fundus: firm Incision: healing well DVT Evaluation: No evidence of DVT seen on physical exam.  Discharge Diagnoses: Term Pregnancy-delivered BREECH Discharge Information: Date: 11/11/2017 Activity: pelvic rest Diet: routine Medications: PNV and Ibuprofen Condition: stable Instructions: refer to practice specific booklet Discharge to: home Follow-up Information    [place] Schedule an appointment as soon as possible for a visit in 1 week(s).   Contact information: [address] [place] [id number] [phone]           Newborn Data: Live born female  Birth Weight: 8 lb 1.5 oz (3670 g) APGAR: 8, 9  Newborn Delivery   Birth date/time:  11/09/2017 10:11:00 Delivery type:  C-Section, Low Transverse Trial of labor:  No C-section categorization:  Primary     Home with mother.  Sydney Morse 11/11/2017, 8:09 AM

## 2019-04-05 ENCOUNTER — Ambulatory Visit: Payer: Self-pay | Attending: Internal Medicine

## 2019-04-05 DIAGNOSIS — Z20822 Contact with and (suspected) exposure to covid-19: Secondary | ICD-10-CM | POA: Insufficient documentation

## 2019-04-06 LAB — NOVEL CORONAVIRUS, NAA: SARS-CoV-2, NAA: NOT DETECTED

## 2019-08-15 DIAGNOSIS — J209 Acute bronchitis, unspecified: Secondary | ICD-10-CM | POA: Diagnosis not present

## 2019-10-23 DIAGNOSIS — M79604 Pain in right leg: Secondary | ICD-10-CM | POA: Diagnosis not present

## 2019-10-23 DIAGNOSIS — M545 Low back pain: Secondary | ICD-10-CM | POA: Diagnosis not present

## 2020-01-10 DIAGNOSIS — R6882 Decreased libido: Secondary | ICD-10-CM | POA: Diagnosis not present

## 2020-01-10 DIAGNOSIS — Z6827 Body mass index (BMI) 27.0-27.9, adult: Secondary | ICD-10-CM | POA: Diagnosis not present

## 2020-01-10 DIAGNOSIS — Z01419 Encounter for gynecological examination (general) (routine) without abnormal findings: Secondary | ICD-10-CM | POA: Diagnosis not present

## 2020-04-16 DIAGNOSIS — Z20822 Contact with and (suspected) exposure to covid-19: Secondary | ICD-10-CM | POA: Diagnosis not present

## 2020-04-16 DIAGNOSIS — U071 COVID-19: Secondary | ICD-10-CM | POA: Diagnosis not present

## 2020-05-07 ENCOUNTER — Encounter: Payer: Self-pay | Admitting: Orthopaedic Surgery

## 2020-05-07 ENCOUNTER — Ambulatory Visit (INDEPENDENT_AMBULATORY_CARE_PROVIDER_SITE_OTHER): Payer: BC Managed Care – PPO | Admitting: Orthopaedic Surgery

## 2020-05-07 ENCOUNTER — Ambulatory Visit: Payer: Self-pay

## 2020-05-07 ENCOUNTER — Other Ambulatory Visit: Payer: Self-pay

## 2020-05-07 DIAGNOSIS — M5441 Lumbago with sciatica, right side: Secondary | ICD-10-CM | POA: Diagnosis not present

## 2020-05-07 DIAGNOSIS — M25551 Pain in right hip: Secondary | ICD-10-CM

## 2020-05-07 DIAGNOSIS — G8929 Other chronic pain: Secondary | ICD-10-CM

## 2020-05-07 DIAGNOSIS — M25559 Pain in unspecified hip: Secondary | ICD-10-CM

## 2020-05-07 NOTE — Progress Notes (Signed)
Office Visit Note   Patient: Sydney Morse           Date of Birth: 1980/06/29           MRN: [id number] Visit Date: 05/07/2020              Requested by: No referring provider defined for this encounter. PCP: Patient, No Pcp Per   Assessment & Plan: Visit Diagnoses:  1. Hip pain   2. Chronic right-sided low back pain with right-sided sciatica     Plan: Impression is right hip labral tear.  We will obtain MR arthrogram at this time.  Follow-up after the MRI.  We discussed cortisone injections but will hold off until we have the MRI.  Follow-Up Instructions: Return if symptoms worsen or fail to improve.   Orders:  Orders Placed This Encounter  Procedures  . XR Lumbar Spine 2-3 Views  . XR HIP UNILAT W OR W/O PELVIS 2-3 VIEWS RIGHT  . MR Hip Right w/ contrast  . Arthrogram   No orders of the defined types were placed in this encounter.     Procedures: No procedures performed   Clinical Data: No additional findings.   Subjective: Chief Complaint  Patient presents with  . Lower Back - Pain    Patient is a very pleasant 40 year old female who comes in for evaluation of chronic right hip pain since 2019.  Denies any injuries.  She is the daughter of Shella Maxim who is a longtime patient of mine.  She states that around the time when her hip started hurting she is pregnant and had a lot of right lower extremity swelling because of the position of the fetus.  However she continued to have right hip pain after giving birth and has been going on since then.  She saw Dr. Darrelyn Hillock at [facility] in July 2021 for this issue and was provided with reassurance that there are no structural abnormalities.  She states that she has increased pain with walking but denies any weakness or numbness and tingling or radicular symptoms.  Aspirin helps.  She endorses pain around the hip and groin region.   Review of Systems  Constitutional: Negative.   HENT: Negative.   Eyes: Negative.    Respiratory: Negative.   Cardiovascular: Negative.   Endocrine: Negative.   Musculoskeletal: Negative.   Neurological: Negative.   Hematological: Negative.   Psychiatric/Behavioral: Negative.   All other systems reviewed and are negative.    Objective: Vital Signs: There were no vitals taken for this visit.  Physical Exam Vitals and nursing note reviewed.  Constitutional:      Appearance: She is well-developed and well-nourished.  HENT:     Head: Normocephalic and atraumatic.  Eyes:     Extraocular Movements: EOM normal.  Pulmonary:     Effort: Pulmonary effort is normal.  Abdominal:     Palpations: Abdomen is soft.  Musculoskeletal:     Cervical back: Neck supple.  Skin:    General: Skin is warm.     Capillary Refill: Capillary refill takes less than 2 seconds.  Neurological:     Mental Status: She is alert and oriented to person, place, and time.  Psychiatric:        Mood and Affect: Mood and affect normal.        Behavior: Behavior normal.        Thought Content: Thought content normal.        Judgment: Judgment normal.  Ortho Exam Right hip shows good range of motion with logroll without pain.  Positive FADIR.  Reproducible pain with terminal hip flexion and abduction.  Pain with Stinchfield testing.  Lateral hip is nontender.  Negative sciatic tension signs. Specialty Comments:  No specialty comments available.  Imaging: XR HIP UNILAT W OR W/O PELVIS 2-3 VIEWS RIGHT  Result Date: 05/07/2020 No acute or structural abnormalities  XR Lumbar Spine 2-3 Views  Result Date: 05/07/2020 No acute or structural abnormalities.    PMFS History: Patient Active Problem List   Diagnosis Date Noted  . Cesarean delivery delivered 11/09/2017   Past Medical History:  Diagnosis Date  . AMA (advanced maternal age) primigravida 35+   . HSV (herpes simplex virus) anogenital infection   . Vaginal Pap smear, abnormal     Family History  Problem Relation Age of Onset   . Hypertension Mother   . Diabetes Brother   . Breast cancer Maternal Aunt   . Hypertension Maternal Grandmother   . Lung cancer Maternal Grandfather     Past Surgical History:  Procedure Laterality Date  . CESAREAN SECTION N/A 11/09/2017   Procedure: CESAREAN SECTION;  Surgeon: Candice Camp, MD;  Location: [facility] BIRTHING SUIT[facility];  Service: Obstetrics;  Laterality: N/A;  Primary edc 11/13/17 allergy to sulfa and emycin Heather, RNFA  . GYNECOLOGIC CRYOSURGERY    . WISDOM TOOTH EXTRACTION     Social History   Occupational History  . Not on file  Tobacco Use  . Smoking status: Never Smoker  . Smokeless tobacco: Never Used  Substance and Sexual Activity  . Alcohol use: Not Currently  . Drug use: Never  . Sexual activity: Not on file

## 2020-06-03 ENCOUNTER — Ambulatory Visit
Admission: RE | Admit: 2020-06-03 | Discharge: 2020-06-03 | Disposition: A | Discharge status: Home / Self Care | Payer: BC Managed Care – PPO | Source: Ambulatory Visit | Attending: Orthopaedic Surgery | Admitting: Orthopaedic Surgery

## 2020-06-03 ENCOUNTER — Other Ambulatory Visit: Payer: Self-pay

## 2020-06-03 DIAGNOSIS — M25559 Pain in unspecified hip: Secondary | ICD-10-CM

## 2020-06-03 DIAGNOSIS — M25551 Pain in right hip: Secondary | ICD-10-CM | POA: Diagnosis not present

## 2020-06-03 DIAGNOSIS — G8929 Other chronic pain: Secondary | ICD-10-CM | POA: Diagnosis not present

## 2020-06-03 MED ORDER — IOPAMIDOL (ISOVUE-M 200) INJECTION 41%
12.0000 mL | Freq: Once | INTRAMUSCULAR | Status: AC
  Administered 2020-06-03: 12 mL via INTRA_ARTICULAR

## 2020-06-07 ENCOUNTER — Encounter: Payer: Self-pay | Admitting: Orthopaedic Surgery

## 2020-06-07 ENCOUNTER — Other Ambulatory Visit: Payer: Self-pay

## 2020-06-07 ENCOUNTER — Ambulatory Visit: Payer: BC Managed Care – PPO | Admitting: Orthopaedic Surgery

## 2020-06-07 VITALS — Ht 60.0 in | Wt 163.0 lb

## 2020-06-07 DIAGNOSIS — M25551 Pain in right hip: Secondary | ICD-10-CM | POA: Diagnosis not present

## 2020-06-07 MED ORDER — DICLOFENAC SODIUM 75 MG PO TBEC: 75.0000 mg | DELAYED_RELEASE_TABLET | Freq: Two times a day (BID) | ORAL | 2 refills | Status: AC | PRN

## 2020-06-07 NOTE — Progress Notes (Signed)
Office Visit Note   Patient: Sydney Morse           Date of Birth: 06/28/1980           MRN: [id number] Visit Date: 06/07/2020              Requested by: No referring provider defined for this encounter. PCP: Patient, No Pcp Per   Assessment & Plan: Visit Diagnoses:  1. Pain in right hip     Plan: Impression is chronic right sided groin and right lower extremity pain.  At this point, I do not feel her pain is coming from her hip but rather her back.  We have discussed starting her on an NSAID and physical therapy versus going ahead and obtaining an MRI.  She would like to try a course of physical therapy and anti-inflammatories first.  She will call us over the next 6 to 8 weeks if her symptoms have not improved.  Follow-up with [place] as needed.  Follow-Up Instructions: Return if symptoms worsen or fail to improve.   Orders:  Orders Placed This Encounter  Procedures  . Ambulatory referral to Physical Therapy   No orders of the defined types were placed in this encounter.     Procedures: No procedures performed   Clinical Data: No additional findings.   Subjective: Chief Complaint  Patient presents with  . Right Hip - Follow-up    MRI review    HPI patient is a very pleasant 40 year old girl who comes in today to discuss MRI results of her right hip.  She has had right hip pain ever since being pregnant with her now 85-1/2-year-old daughter.  She notes that while pregnant, her daughter was in a breech position lying on her right side.  She had feeling of pressure as well as swelling to the right lower extremity for quite some time.  Ultrasound was obtained which was negative for blood clots.  She notes that even after delivery the symptoms have persisted.  Her pain is on only to the groin but does note pain down the anterior leg past her knee which is worse after she has been on the floor playing with her daughter or sitting up in her bed with her legs extended.  She does  note occasional paresthesias to the right leg.  No actual back pain.  No bowel or bladder change or paresthesias.  No previous injection in the hip or back.  She has not been to physical therapy for this.  Review of Systems as detailed in HPI.  All others reviewed and are negative.   Objective: Vital Signs: Ht 5' (1.524 m)   Wt 163 lb (73.9 kg)   LMP 05/13/2020 (Approximate)   BMI 31.83 kg/m   Physical Exam well-developed and well-nourished female no acute distress.  Alert oriented x3.  Ortho Exam examination of her right hip reveals no tenderness to the greater trochanter or anywhere along the lateral hip.  Negative logroll.  Minimally positive FADIR.  Negative straight leg raise.  No focal weakness.  Unremarkable lumbar spine exam.  She is neurovascularly intact distally.  Specialty Comments:  No specialty comments available.  Imaging: No new imaging   PMFS History: Patient Active Problem List   Diagnosis Date Noted  . Cesarean delivery delivered 11/09/2017   Past Medical History:  Diagnosis Date  . AMA (advanced maternal age) primigravida 35+   . HSV (herpes simplex virus) anogenital infection   . Vaginal Pap smear, abnormal  Family History  Problem Relation Age of Onset  . Hypertension Mother   . Diabetes Brother   . Breast cancer Maternal Aunt   . Hypertension Maternal Grandmother   . Lung cancer Maternal Grandfather     Past Surgical History:  Procedure Laterality Date  . CESAREAN SECTION N/A 11/09/2017   Procedure: CESAREAN SECTION;  Surgeon: Candice Camp, MD;  Location: [facility];  Service: Obstetrics;  Laterality: N/A;  Primary edc 11/13/17 allergy to sulfa and emycin Heather, RNFA  . GYNECOLOGIC CRYOSURGERY    . WISDOM TOOTH EXTRACTION     Social History   Occupational History  . Not on file  Tobacco Use  . Smoking status: Never Smoker  . Smokeless tobacco: Never Used  Substance and Sexual Activity  . Alcohol use: Not Currently  . Drug  use: Never  . Sexual activity: Not on file

## 2020-07-08 ENCOUNTER — Encounter: Payer: Self-pay | Admitting: Physical Therapy

## 2020-07-08 ENCOUNTER — Ambulatory Visit (INDEPENDENT_AMBULATORY_CARE_PROVIDER_SITE_OTHER): Payer: BC Managed Care – PPO | Admitting: Physical Therapy

## 2020-07-08 ENCOUNTER — Other Ambulatory Visit: Payer: Self-pay

## 2020-07-08 DIAGNOSIS — M6281 Muscle weakness (generalized): Secondary | ICD-10-CM

## 2020-07-08 DIAGNOSIS — R262 Difficulty in walking, not elsewhere classified: Secondary | ICD-10-CM

## 2020-07-08 DIAGNOSIS — M25551 Pain in right hip: Secondary | ICD-10-CM | POA: Diagnosis not present

## 2020-07-08 NOTE — Addendum Note (Signed)
Addended by: April Manson on: 07/08/2020 11:32 AM   Modules accepted: Orders

## 2020-07-08 NOTE — Therapy (Addendum)
[facility] [address] [place], [place], [address] Phone: [phone]   Fax:  [phone]  Physical Therapy Evaluation/  Discharge   Patient Details  Name: Sydney Morse MRN: [id number] Date of Birth: 1980-09-21 Referring Provider (PT): Nathaniel Man,   Encounter Date: 07/08/2020   PT End of Session - 07/08/20 1107     Visit Number 1    Number of Visits 8    Date for PT Re-Evaluation 09/30/20    PT Start Time 0850    PT Stop Time 0935    PT Time Calculation (min) 45 min    Activity Tolerance Patient tolerated treatment well    Behavior During Therapy [facility] for tasks assessed/performed             Past Medical History:  Diagnosis Date   AMA (advanced maternal age) primigravida 35+    HSV (herpes simplex virus) anogenital infection    Vaginal Pap smear, abnormal     Past Surgical History:  Procedure Laterality Date   CESAREAN SECTION N/A 11/09/2017   Procedure: CESAREAN SECTION;  Surgeon: Louretta Shorten, MD;  Location: [facility];  Service: Obstetrics;  Laterality: N/A;  Primary edc 11/13/17 allergy to sulfa and emycin Heather, RNFA   GYNECOLOGIC CRYOSURGERY     WISDOM TOOTH EXTRACTION      There were no vitals filed for this visit.    Subjective Assessment - 07/08/20 1038     Subjective She has had right hip pain ever since being pregnant with her now 39-year-old daughter. She had feeling of pressure as well as swelling to the right lower extremity for quite some time.  Ultrasound was obtained which was negative for blood clots. Her pain is on only to the groin but does note pain down the anterior leg past her knee which is worse after she has been on the floor playing with her daughter or sitting up in her bed with her legs extended.  She does note occasional paresthesias to the right leg.  No actual back pain.  No bowel or bladder change or paresthesias.  She localizes pain to groin and feels like a pinching  pain.    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 2. No significant arthropathy of the right hip.  Intact labrum."    Patient Stated Goals be more active and reduce pan    Currently in Pain? Yes    Pain Score --   pain ranges from 0-8 when she gets it   Pain Location Hip    Pain Orientation Right    Pain Descriptors / Indicators Throbbing   pinching   Pain Type Acute pain    Pain Radiating Towards sometimes N/T to her mid thigh    Pain Onset More than a month ago    Pain Frequency Intermittent    Aggravating Factors  squats, down on the floor to play with child, prolonged walking/standing activity    Pain Relieving Factors rest    Multiple Pain Sites No                [facility] PT Assessment - 07/08/20 0001       Assessment   Medical Diagnosis Chronic Rt hip pain    Referring Provider (PT) Aundra Dubin, PA-C,      Balance Screen   Has the patient fallen in the past 6 months No  Has the patient had a decrease in activity level because of a fear of falling?  No    Is the patient reluctant to leave their home because of a fear of falling?  No      Home Ecologist residence      Prior Function   Level of Independence Independent    Vocation Full time employment    Vocation Requirements accounting, sitting work    Leisure wants to start being more active and ride [employer] bike      Cognition   Overall Cognitive Status Within Functional Limits for tasks assessed      Observation/Other Assessments   Focus on Therapeutic Outcomes (FOTO)  59% functional intake, goal is 77      Functional Tests   Functional tests Squat;Single leg stance      Squat   Comments pain below 90 deg in her Rt groin      Single Leg Stance   Comments can hold 30 sec without trende[place]burg      ROM / Strength   AROM /  PROM / Strength AROM;Strength      AROM   Overall AROM Comments Rt Hip WNL except IR limited to 20 deg and painful, lumbar ROM WNL      Strength   Overall Strength Comments Lt hip 4/5 MMT grossly, Rt hip 3+ MMT grossly and painful      Flexibility   Soft Tissue Assessment /Muscle Length --   mildly tight hip rotators and hamstring on Rt     Special Tests   Other special tests negative SLR test, no back pain with active movements, + hip impingment signs      Transfers   Transfers Independent with all Transfers      Ambulation/Gait   Gait Comments WNL                        Objective measurements completed on examination: See above findings.       [facility] Adult PT Treatment/Exercise - 07/08/20 0001       Exercises   Exercises Knee/Hip      Knee/Hip Exercises: Aerobic   Recumbent Bike 5 min      Modalities   Modalities Cryotherapy      Cryotherapy   Number Minutes Cryotherapy 10 Minutes    Cryotherapy Location Hip    Type of Cryotherapy Ice pack      Manual Therapy   Manual therapy comments Rt hip long axis distraction 60 sec X5 grade 2-5. Upon recheck able to perform deeper squat without pain.                    PT Education - 07/08/20 1107     Education Details HEP,POC, graded activity progression recomendations    Person(s) Educated Patient    Methods Explanation;Demonstration;Verbal cues;Handout    Comprehension Verbalized understanding;Need further instruction              PT Short Term Goals - 07/08/20 1126       PT SHORT TERM GOAL #1   Title Pt will be I and compliant with HEP.    Time 4    Period Weeks    Status New    Target Date 08/05/20               PT Long Term Goals - 07/08/20 1126       PT LONG TERM GOAL #  1   Title Pt will improve FOTO to 77% functional score    Time 12    Period Weeks    Status New    Target Date 09/30/20      PT LONG TERM GOAL #2   Title Pt will improve Rt hip strength to 5/5  to improve function.    Time 12    Period Weeks    Status New      PT LONG TERM GOAL #3   Title Pt will improve Rt hip IR ROM to WNL.    Time 12    Period Weeks    Status New      PT LONG TERM GOAL #4   Title Pt will reduce overall Rt hip pain to less than 3/10 with ususal activity including biking, walking, and play/caring for child    Time 12    Period Weeks    Status New                    Plan - 07/08/20 1110     Clinical Impression Statement Pt presents with signs and symptoms consistent of Rt chronic hip/groin pain and hip impingment. She did not show any signs of lumbar radiculopathy today. She has overall decreased hip ROM, decreased strength, and decreased activity tolerance particularly with standing or walking, and increased pain limiting her functional abilities. She will benefit from skilled PT to address her deficits.    Examination-Activity Limitations Bend;Carry;Lift;Squat    Examination-Participation Restrictions Cleaning;Community Activity;Shop;Other   playing with child   Stability/Clinical Decision Making Stable/Uncomplicated    Clinical Decision Making Low    Rehab Potential Good    PT Frequency 1x / week    PT Duration 12 weeks    PT Treatment/Interventions ADLs/Self Care Home Management;Electrical Stimulation;Cryotherapy;Iontophoresis 39m/ml Dexamethasone;Moist Heat;Traction;Ultrasound;Therapeutic activities;Therapeutic exercise;Neuromuscular re-education;Manual techniques;Patient/family education;Passive range of motion;Dry needling;Joint Manipulations;Spinal Manipulations;Taping    PT Next Visit Plan due to work schedule plans to come once every 2 weeks for now. She responded well to hip long axis distraction and hip manipulation last time, needs hip strength and graded execise progression    PT Home Exercise Plan Access Code: [email or web address]   Consulted and Agree with Plan of Care Patient             Patient will benefit from skilled therapeutic  intervention in order to improve the following deficits and impairments:  Decreased activity tolerance,Decreased range of motion,Decreased strength,Impaired flexibility,Pain  Visit Diagnosis: Pain in right hip  Muscle weakness (generalized)  Difficulty in walking, not elsewhere classified     Problem List Patient Active Problem List   Diagnosis Date Noted   Cesarean delivery delivered 11/09/2017    BSilvestre Mesi4/01/2021, 11:30 AM  PHYSICAL THERAPY DISCHARGE SUMMARY  Visits from Start of Care: 1  Current functional level related to goals / functional outcomes: See note   Remaining deficits: See note   Education / Equipment: HEP   Patient agrees to discharge. Patient goals were not met. Patient is being discharged due to not returning since the last visit.  MScot Jun PT, DPT, OCS, ATC 09/18/20  10:33 AM     [facility] [address] [address]Phone: [phone]  Fax:  [phone] Name: DLichelle VietsMRN: [id number] of Birth: 905-31-1982

## 2020-07-08 NOTE — Patient Instructions (Signed)
Access Code: [email or web address] URL: [email or web address] Date: 07/08/2020 Prepared by: Ivery Quale  Exercises Seated Sciatic Tensioner - 2-3 x daily - 6 x weekly - 1 sets - 10 reps - 3 hold Standing Hip Internal Rotation Stretch on Step - 1-2 x daily - 6 x weekly - 2-3 sets - 20 hold Standing Marching - 2 x daily - 6 x weekly - 1-2 sets - 10 reps - 5 sec hold Hip Extension with Resistance Loop - 2 x daily - 6 x weekly - 1-2 sets - 15-20 reps Hip Abduction with Resistance Loop - 2 x daily - 6 x weekly - 1-2 sets - 15-20 reps Supine Bridge - 2 x daily - 6 x weekly - 1-2 sets - 10 reps - 5 hold

## 2020-07-24 ENCOUNTER — Encounter: Payer: BC Managed Care – PPO | Admitting: Physical Therapy

## 2020-07-24 DIAGNOSIS — B349 Viral infection, unspecified: Secondary | ICD-10-CM | POA: Diagnosis not present

## 2020-07-24 DIAGNOSIS — R059 Cough, unspecified: Secondary | ICD-10-CM | POA: Diagnosis not present

## 2021-02-04 DIAGNOSIS — R7309 Other abnormal glucose: Secondary | ICD-10-CM | POA: Diagnosis not present

## 2021-02-04 DIAGNOSIS — Z1329 Encounter for screening for other suspected endocrine disorder: Secondary | ICD-10-CM | POA: Diagnosis not present

## 2021-02-04 DIAGNOSIS — Z6829 Body mass index (BMI) 29.0-29.9, adult: Secondary | ICD-10-CM | POA: Diagnosis not present

## 2021-02-04 DIAGNOSIS — Z01419 Encounter for gynecological examination (general) (routine) without abnormal findings: Secondary | ICD-10-CM | POA: Diagnosis not present

## 2021-02-14 DIAGNOSIS — Z1231 Encounter for screening mammogram for malignant neoplasm of breast: Secondary | ICD-10-CM | POA: Diagnosis not present

## 2021-02-25 DIAGNOSIS — R635 Abnormal weight gain: Secondary | ICD-10-CM | POA: Diagnosis not present

## 2021-02-25 DIAGNOSIS — R5381 Other malaise: Secondary | ICD-10-CM | POA: Diagnosis not present

## 2021-02-25 DIAGNOSIS — Z6830 Body mass index (BMI) 30.0-30.9, adult: Secondary | ICD-10-CM | POA: Diagnosis not present

## 2021-02-25 DIAGNOSIS — R0602 Shortness of breath: Secondary | ICD-10-CM | POA: Diagnosis not present

## 2021-02-25 DIAGNOSIS — R7301 Impaired fasting glucose: Secondary | ICD-10-CM | POA: Diagnosis not present

## 2021-02-25 DIAGNOSIS — E669 Obesity, unspecified: Secondary | ICD-10-CM | POA: Diagnosis not present

## 2021-02-25 DIAGNOSIS — E7800 Pure hypercholesterolemia, unspecified: Secondary | ICD-10-CM | POA: Diagnosis not present

## 2021-02-25 DIAGNOSIS — E559 Vitamin D deficiency, unspecified: Secondary | ICD-10-CM | POA: Diagnosis not present

## 2021-02-26 DIAGNOSIS — R7301 Impaired fasting glucose: Secondary | ICD-10-CM | POA: Diagnosis not present

## 2021-03-11 DIAGNOSIS — L814 Other melanin hyperpigmentation: Secondary | ICD-10-CM | POA: Diagnosis not present

## 2021-03-11 DIAGNOSIS — L218 Other seborrheic dermatitis: Secondary | ICD-10-CM | POA: Diagnosis not present

## 2021-03-11 DIAGNOSIS — Z7689 Persons encountering health services in other specified circumstances: Secondary | ICD-10-CM | POA: Diagnosis not present

## 2021-03-11 DIAGNOSIS — E559 Vitamin D deficiency, unspecified: Secondary | ICD-10-CM | POA: Diagnosis not present

## 2021-03-11 DIAGNOSIS — L821 Other seborrheic keratosis: Secondary | ICD-10-CM | POA: Diagnosis not present

## 2021-03-11 DIAGNOSIS — E663 Overweight: Secondary | ICD-10-CM | POA: Diagnosis not present

## 2021-03-11 DIAGNOSIS — D225 Melanocytic nevi of trunk: Secondary | ICD-10-CM | POA: Diagnosis not present

## 2021-03-11 DIAGNOSIS — Z3041 Encounter for surveillance of contraceptive pills: Secondary | ICD-10-CM | POA: Diagnosis not present

## 2021-06-16 DIAGNOSIS — E559 Vitamin D deficiency, unspecified: Secondary | ICD-10-CM | POA: Diagnosis not present

## 2021-06-16 DIAGNOSIS — R739 Hyperglycemia, unspecified: Secondary | ICD-10-CM | POA: Diagnosis not present

## 2021-06-24 DIAGNOSIS — L74510 Primary focal hyperhidrosis, axilla: Secondary | ICD-10-CM | POA: Diagnosis not present

## 2021-06-24 DIAGNOSIS — E559 Vitamin D deficiency, unspecified: Secondary | ICD-10-CM | POA: Diagnosis not present

## 2021-06-24 DIAGNOSIS — R7303 Prediabetes: Secondary | ICD-10-CM | POA: Diagnosis not present

## 2021-06-24 DIAGNOSIS — E663 Overweight: Secondary | ICD-10-CM | POA: Diagnosis not present

## 2021-07-17 IMAGING — XA DG FLUORO GUIDE NDL PLC/BX
2 series · 2 of 2 positions shown · non-contrast
Comparison: none

CLINICAL DATA: Chronic right hip pain.

[Series 1: ortho adipose · 1 of 1 slices shown (1 of 2)]
[im 1/1]
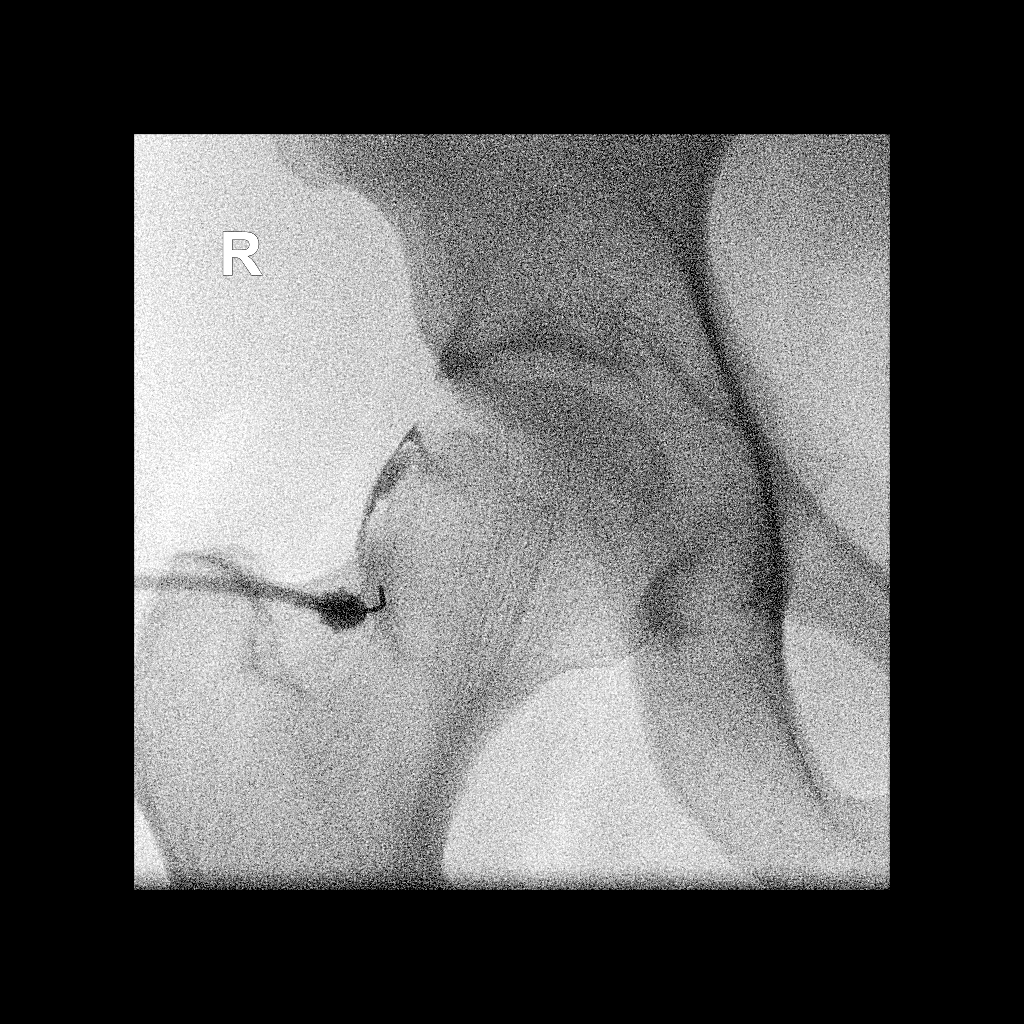

[Series 2: ortho adipose · 1 of 1 slices shown (2 of 2)]
[im 1/1]
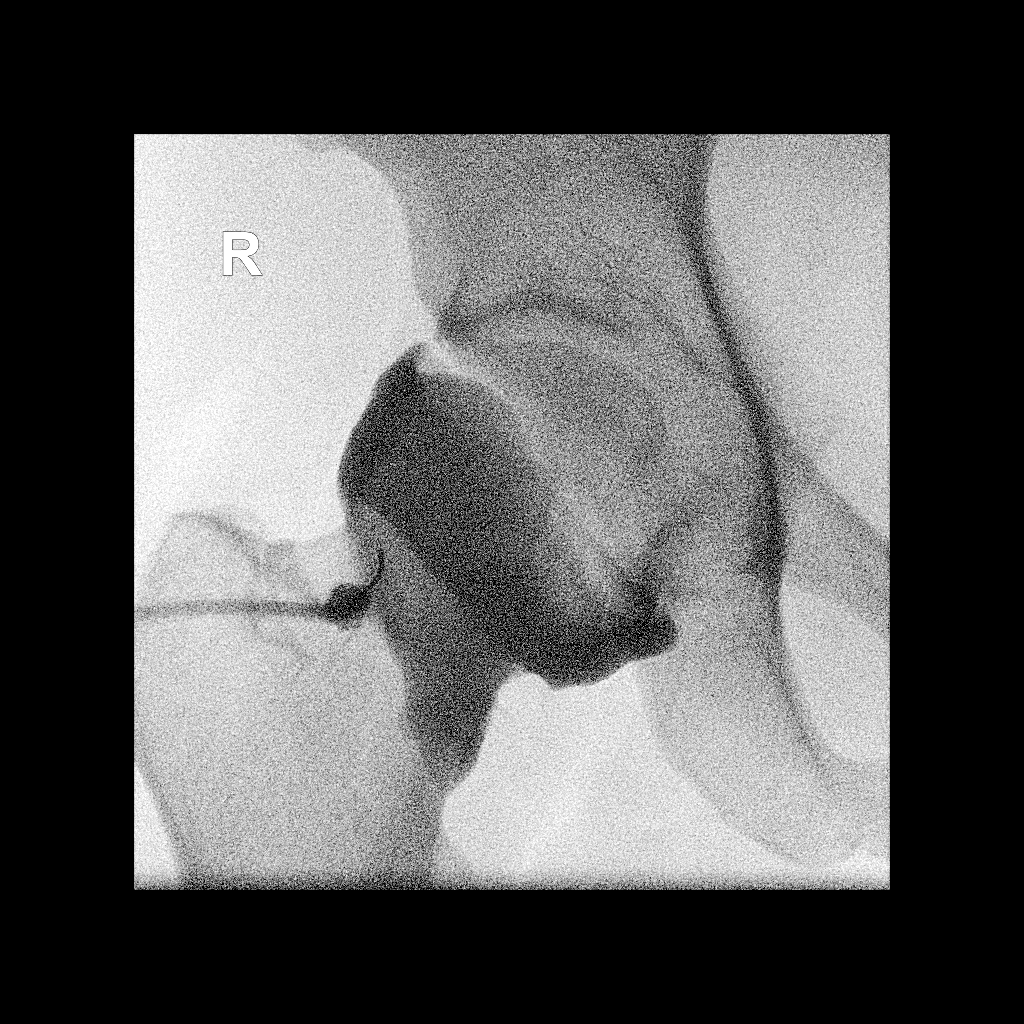

[2 of 2 positions shown; findings below may reference images not displayed]

FLUOROSCOPY TIME:  Radiation Exposure Index (as provided by the
fluoroscopic device): 0.2 mGy

Fluoroscopy Time:  8 seconds

Number of Acquired Images:  0

PROCEDURE:
The risks and benefits of the procedure were discussed with the
patient, and written informed consent was obtained. The patient
stated no history of allergy to contrast media. A formal timeout
procedure was performed with the patient according to departmental
protocol.

The patient was placed supine on the fluoroscopy table and the right
hip joint was identified under fluoroscopy. The skin overlying the
right hip joint was subsequently cleaned with Betadine and a sterile
drape was placed over the area of interest. 2 ml 1% Lidocaine was
used to anesthetize the skin around the needle insertion site.

A 22 gauge spinal needle was inserted into the right hip joint under
fluoroscopy.

12 ml of gadolinium mixture (0.1 ml of Multihance mixed with 15 ml
of Isovue-M 200 contrast and 5 ml of sterile saline) were injected
into the right hip joint.

The needle was removed and hemostasis was achieved. The patient was
subsequently transferred to MRI for imaging.
IMPRESSION: Technically successful right hip injection for MRI.

## 2021-08-06 DIAGNOSIS — R7303 Prediabetes: Secondary | ICD-10-CM | POA: Diagnosis not present

## 2021-08-06 DIAGNOSIS — E663 Overweight: Secondary | ICD-10-CM | POA: Diagnosis not present

## 2021-09-03 DIAGNOSIS — E663 Overweight: Secondary | ICD-10-CM | POA: Diagnosis not present

## 2021-09-03 DIAGNOSIS — R7303 Prediabetes: Secondary | ICD-10-CM | POA: Diagnosis not present

## 2021-10-01 DIAGNOSIS — Z87898 Personal history of other specified conditions: Secondary | ICD-10-CM | POA: Diagnosis not present

## 2021-10-01 DIAGNOSIS — E663 Overweight: Secondary | ICD-10-CM | POA: Diagnosis not present

## 2021-11-05 DIAGNOSIS — Z0000 Encounter for general adult medical examination without abnormal findings: Secondary | ICD-10-CM | POA: Diagnosis not present

## 2021-11-05 DIAGNOSIS — R7303 Prediabetes: Secondary | ICD-10-CM | POA: Diagnosis not present

## 2021-11-05 DIAGNOSIS — N926 Irregular menstruation, unspecified: Secondary | ICD-10-CM | POA: Diagnosis not present

## 2021-11-05 DIAGNOSIS — Z114 Encounter for screening for human immunodeficiency virus [HIV]: Secondary | ICD-10-CM | POA: Diagnosis not present

## 2021-11-05 DIAGNOSIS — Z13220 Encounter for screening for lipoid disorders: Secondary | ICD-10-CM | POA: Diagnosis not present

## 2021-11-05 DIAGNOSIS — R7401 Elevation of levels of liver transaminase levels: Secondary | ICD-10-CM | POA: Diagnosis not present

## 2021-11-05 DIAGNOSIS — N946 Dysmenorrhea, unspecified: Secondary | ICD-10-CM | POA: Diagnosis not present

## 2021-11-18 DIAGNOSIS — Z23 Encounter for immunization: Secondary | ICD-10-CM | POA: Diagnosis not present

## 2021-11-18 DIAGNOSIS — Z0000 Encounter for general adult medical examination without abnormal findings: Secondary | ICD-10-CM | POA: Diagnosis not present

## 2021-12-16 DIAGNOSIS — Z23 Encounter for immunization: Secondary | ICD-10-CM | POA: Diagnosis not present

## 2022-01-29 DIAGNOSIS — R7303 Prediabetes: Secondary | ICD-10-CM | POA: Diagnosis not present

## 2022-01-29 DIAGNOSIS — N946 Dysmenorrhea, unspecified: Secondary | ICD-10-CM | POA: Diagnosis not present

## 2022-01-29 DIAGNOSIS — L74510 Primary focal hyperhidrosis, axilla: Secondary | ICD-10-CM | POA: Diagnosis not present

## 2022-01-29 DIAGNOSIS — Z7185 Encounter for immunization safety counseling: Secondary | ICD-10-CM | POA: Diagnosis not present

## 2022-02-26 DIAGNOSIS — Z6826 Body mass index (BMI) 26.0-26.9, adult: Secondary | ICD-10-CM | POA: Diagnosis not present

## 2022-03-11 DIAGNOSIS — I8393 Asymptomatic varicose veins of bilateral lower extremities: Secondary | ICD-10-CM | POA: Diagnosis not present

## 2022-03-11 DIAGNOSIS — Z872 Personal history of diseases of the skin and subcutaneous tissue: Secondary | ICD-10-CM | POA: Diagnosis not present

## 2022-03-11 DIAGNOSIS — L218 Other seborrheic dermatitis: Secondary | ICD-10-CM | POA: Diagnosis not present

## 2022-03-26 DIAGNOSIS — E663 Overweight: Secondary | ICD-10-CM | POA: Diagnosis not present

## 2022-03-27 DIAGNOSIS — J111 Influenza due to unidentified influenza virus with other respiratory manifestations: Secondary | ICD-10-CM | POA: Diagnosis not present

## 2022-04-16 DIAGNOSIS — Z6827 Body mass index (BMI) 27.0-27.9, adult: Secondary | ICD-10-CM | POA: Diagnosis not present

## 2022-04-16 DIAGNOSIS — Z01419 Encounter for gynecological examination (general) (routine) without abnormal findings: Secondary | ICD-10-CM | POA: Diagnosis not present

## 2022-04-16 DIAGNOSIS — Z1231 Encounter for screening mammogram for malignant neoplasm of breast: Secondary | ICD-10-CM | POA: Diagnosis not present

## 2022-04-16 DIAGNOSIS — Z3202 Encounter for pregnancy test, result negative: Secondary | ICD-10-CM | POA: Diagnosis not present

## 2022-04-23 DIAGNOSIS — E663 Overweight: Secondary | ICD-10-CM | POA: Diagnosis not present

## 2022-06-17 DIAGNOSIS — J069 Acute upper respiratory infection, unspecified: Secondary | ICD-10-CM | POA: Diagnosis not present

## 2022-07-21 DIAGNOSIS — L710 Perioral dermatitis: Secondary | ICD-10-CM | POA: Diagnosis not present

## 2022-09-22 DIAGNOSIS — L718 Other rosacea: Secondary | ICD-10-CM | POA: Diagnosis not present

## 2022-09-22 DIAGNOSIS — L710 Perioral dermatitis: Secondary | ICD-10-CM | POA: Diagnosis not present

## 2022-12-07 DIAGNOSIS — E559 Vitamin D deficiency, unspecified: Secondary | ICD-10-CM | POA: Diagnosis not present

## 2022-12-07 DIAGNOSIS — N926 Irregular menstruation, unspecified: Secondary | ICD-10-CM | POA: Diagnosis not present

## 2022-12-07 DIAGNOSIS — R7303 Prediabetes: Secondary | ICD-10-CM | POA: Diagnosis not present

## 2022-12-08 DIAGNOSIS — R7401 Elevation of levels of liver transaminase levels: Secondary | ICD-10-CM | POA: Diagnosis not present

## 2022-12-08 DIAGNOSIS — E559 Vitamin D deficiency, unspecified: Secondary | ICD-10-CM | POA: Diagnosis not present

## 2022-12-08 DIAGNOSIS — R739 Hyperglycemia, unspecified: Secondary | ICD-10-CM | POA: Diagnosis not present

## 2022-12-08 DIAGNOSIS — Z13220 Encounter for screening for lipoid disorders: Secondary | ICD-10-CM | POA: Diagnosis not present

## 2022-12-11 DIAGNOSIS — Z0000 Encounter for general adult medical examination without abnormal findings: Secondary | ICD-10-CM | POA: Diagnosis not present

## 2022-12-11 DIAGNOSIS — Z23 Encounter for immunization: Secondary | ICD-10-CM | POA: Diagnosis not present

## 2023-03-17 DIAGNOSIS — D225 Melanocytic nevi of trunk: Secondary | ICD-10-CM | POA: Diagnosis not present

## 2023-03-17 DIAGNOSIS — L821 Other seborrheic keratosis: Secondary | ICD-10-CM | POA: Diagnosis not present

## 2023-03-17 DIAGNOSIS — L710 Perioral dermatitis: Secondary | ICD-10-CM | POA: Diagnosis not present

## 2023-03-17 DIAGNOSIS — L814 Other melanin hyperpigmentation: Secondary | ICD-10-CM | POA: Diagnosis not present

## 2023-03-23 ENCOUNTER — Ambulatory Visit
Admission: EM | Admit: 2023-03-23 | Discharge: 2023-03-23 | Disposition: A | Discharge status: Home / Self Care | Payer: BC Managed Care – PPO | Attending: Family Medicine | Admitting: Family Medicine

## 2023-03-23 DIAGNOSIS — J069 Acute upper respiratory infection, unspecified: Secondary | ICD-10-CM | POA: Diagnosis not present

## 2023-03-23 LAB — POC COVID19/FLU A&B COMBO
Covid Antigen, POC: NEGATIVE
Influenza A Antigen, POC: NEGATIVE
Influenza B Antigen, POC: NEGATIVE

## 2023-03-23 MED ORDER — OSELTAMIVIR PHOSPHATE 75 MG PO CAPS
75.0000 mg | ORAL_CAPSULE | Freq: Two times a day (BID) | ORAL | 0 refills | Status: AC
Start: 2023-03-23 — End: ?

## 2023-03-23 MED ORDER — FLUTICASONE PROPIONATE 50 MCG/ACT NA SUSP: 1.0000 | Freq: Two times a day (BID) | NASAL | 2 refills | Status: AC

## 2023-03-23 MED ORDER — PROMETHAZINE-DM 6.25-15 MG/5ML PO SYRP: 5.0000 mL | ORAL_SOLUTION | Freq: Four times a day (QID) | ORAL | 0 refills | Status: AC | PRN

## 2023-03-23 NOTE — ED Triage Notes (Signed)
Pt reports she has body aches , nasal congestion, throat drainage x 1 day.  Wants flu test

## 2023-03-23 NOTE — ED Provider Notes (Signed)
[facility]    CSN: [id number] Arrival date & time: 03/23/23  1116      History   Chief Complaint No chief complaint on file.   HPI Sydney Morse is a 42 y.o. female.   Presenting today with 1 day history of bodyaches, congestion, cough, sore throat.  Denies fever, chills, chest pain, shortness of breath, abdominal pain, nausea vomiting or diarrhea.  So far trying Tylenol with minimal relief.  Requesting flu testing today.  Multiple sick contacts recently.  No known history of chronic pulmonary disease.    Past Medical History:  Diagnosis Date   AMA (advanced maternal age) primigravida 35+    HSV (herpes simplex virus) anogenital infection    Vaginal Pap smear, abnormal     Patient Active Problem List   Diagnosis Date Noted   Cesarean delivery delivered 11/09/2017    Past Surgical History:  Procedure Laterality Date   CESAREAN SECTION N/A 11/09/2017   Procedure: CESAREAN SECTION;  Surgeon: Candice Camp, MD;  Location: [facility];  Service: Obstetrics;  Laterality: N/A;  Primary edc 11/13/17 allergy to sulfa and emycin Heather, RNFA   GYNECOLOGIC CRYOSURGERY     WISDOM TOOTH EXTRACTION      OB History     Gravida  3   Para  1   Term  1   Preterm      AB  2   Living  1      SAB  2   IAB      Ectopic      Multiple  0   Live Births  1            Home Medications    Prior to Admission medications   Medication Sig Start Date End Date Taking? Authorizing Provider  fluticasone (FLONASE) 50 MCG/ACT nasal spray Place 1 spray into both nostrils 2 (two) times daily. 03/23/23  Yes Particia Nearing, PA-C  oseltamivir (TAMIFLU) 75 MG capsule Take 1 capsule (75 mg total) by mouth every 12 (twelve) hours. 03/23/23  Yes Particia Nearing, PA-C  promethazine-dextromethorphan (PROMETHAZINE-DM) 6.25-15 MG/5ML syrup Take 5 mLs by mouth 4 (four) times daily as needed. 03/23/23  Yes Particia Nearing, PA-C  diclofenac  (VOLTAREN) 75 MG EC tablet Take 1 tablet (75 mg total) by mouth 2 (two) times daily as needed. 06/07/20   Cristie Hem, PA-C  docusate sodium (COLACE) 100 MG capsule Take 100 mg by mouth at bedtime.    [provider]  famotidine (PEPCID) 20 MG tablet Take 20 mg by mouth at bedtime as needed (acid reflux/indigestion.).    [provider]  ibuprofen (ADVIL,MOTRIN) 600 MG tablet Take 1 tablet (600 mg total) by mouth every 6 (six) hours. 11/11/17   Richarda Overlie, MD  Prenatal Vit-Fe Fumarate-FA (PRENATAL MULTIVITAMIN) TABS tablet Take 1 tablet by mouth at bedtime.    [provider]    Family History Family History  Problem Relation Age of Onset   Hypertension Mother    Diabetes Brother    Breast cancer Maternal Aunt    Hypertension Maternal Grandmother    Lung cancer Maternal Grandfather     Social History Social History   Tobacco Use   Smoking status: Never   Smokeless tobacco: Never  Substance Use Topics   Alcohol use: Not Currently   Drug use: Never     Allergies   Erythromycin and Sulfa antibiotics   Review of Systems Review of Systems Per HPI  Physical  Exam Triage Vital Signs ED Triage Vitals  Encounter Vitals Group     BP 03/23/23 1130 118/84     Systolic BP Percentile --      Diastolic BP Percentile --      Pulse Rate 03/23/23 1130 (!) 101     Resp 03/23/23 1130 16     Temp 03/23/23 1130 98.5 F (36.9 C)     Temp Source 03/23/23 1130 Oral     SpO2 03/23/23 1130 97 %     Weight --      Height --      Head Circumference --      Peak Flow --      Pain Score 03/23/23 1128 5     Pain Loc --      Pain Education --      Exclude from Growth Chart --    No data found.  Updated Vital Signs BP 118/84 (BP Location: Right Arm)   Pulse (!) 101   Temp 98.5 F (36.9 C) (Oral)   Resp 16   LMP 03/07/2023   SpO2 97%   Visual Acuity Right Eye Distance:   Left Eye Distance:   Bilateral Distance:    Right Eye Near:   Left Eye  Near:    Bilateral Near:     Physical Exam Vitals and nursing note reviewed.  Constitutional:      Appearance: Normal appearance.  HENT:     Head: Atraumatic.     Right Ear: Tympanic membrane and external ear normal.     Left Ear: Tympanic membrane and external ear normal.     Nose: Rhinorrhea present.     Mouth/Throat:     Mouth: Mucous membranes are moist.     Pharynx: Posterior oropharyngeal erythema present.  Eyes:     Extraocular Movements: Extraocular movements intact.     Conjunctiva/sclera: Conjunctivae normal.  Cardiovascular:     Rate and Rhythm: Normal rate and regular rhythm.     Heart sounds: Normal heart sounds.  Pulmonary:     Effort: Pulmonary effort is normal.     Breath sounds: Normal breath sounds. No wheezing.  Musculoskeletal:        General: Normal range of motion.     Cervical back: Normal range of motion and neck supple.  Skin:    General: Skin is warm and dry.  Neurological:     Mental Status: She is alert and oriented to person, place, and time.  Psychiatric:        Mood and Affect: Mood normal.        Thought Content: Thought content normal.      UC Treatments / Results  Labs (all labs ordered are listed, but only abnormal results are displayed) Labs Reviewed  POC COVID19/FLU A&B COMBO    EKG   Radiology No results found.  Procedures Procedures (including critical care time)  Medications Ordered in UC Medications - No data to display  Initial Impression / Assessment and Plan / UC Course  I have reviewed the triage vital signs and the nursing notes.  Pertinent labs & imaging results that were available during my care of the patient were reviewed by me and considered in my medical decision making (see chart for details).     COVID and flu testing negative, vitals and exam reassuring.  Suspect viral respiratory infection.  She is still suspicious for flu so we will treat with Tamiflu in addition to Phenergan DM and Flonase for  viral  symptoms.  Discussed supportive over-the-counter medications and home care.  Return for worsening symptoms.  Final Clinical Impressions(s) / UC Diagnoses   Final diagnoses:  Viral URI   Discharge Instructions   None    ED Prescriptions     Medication Sig Dispense Auth. Provider   oseltamivir (TAMIFLU) 75 MG capsule Take 1 capsule (75 mg total) by mouth every 12 (twelve) hours. 10 capsule Particia Nearing, [place]   promethazine-dextromethorphan (PROMETHAZINE-DM) 6.25-15 MG/5ML syrup Take 5 mLs by mouth 4 (four) times daily as needed. 100 mL Particia Nearing, PA-C   fluticasone [facility]) 50 MCG/ACT nasal spray Place 1 spray into both nostrils 2 (two) times daily. 16 g Particia Nearing, [place]      PDMP not reviewed this encounter.   Particia Nearing, [place] 03/23/23 1228

## 2023-06-18 ENCOUNTER — Other Ambulatory Visit: Payer: Self-pay | Admitting: Family Medicine

## 2023-08-31 DIAGNOSIS — N912 Amenorrhea, unspecified: Secondary | ICD-10-CM | POA: Diagnosis not present

## 2023-08-31 DIAGNOSIS — N9089 Other specified noninflammatory disorders of vulva and perineum: Secondary | ICD-10-CM | POA: Diagnosis not present

## 2023-08-31 DIAGNOSIS — N762 Acute vulvitis: Secondary | ICD-10-CM | POA: Diagnosis not present

## 2023-10-05 DIAGNOSIS — Z01419 Encounter for gynecological examination (general) (routine) without abnormal findings: Secondary | ICD-10-CM | POA: Diagnosis not present

## 2023-10-05 DIAGNOSIS — Z6829 Body mass index (BMI) 29.0-29.9, adult: Secondary | ICD-10-CM | POA: Diagnosis not present

## 2023-10-05 DIAGNOSIS — Z3202 Encounter for pregnancy test, result negative: Secondary | ICD-10-CM | POA: Diagnosis not present

## 2023-10-05 DIAGNOSIS — Z124 Encounter for screening for malignant neoplasm of cervix: Secondary | ICD-10-CM | POA: Diagnosis not present

## 2023-10-05 DIAGNOSIS — Z1231 Encounter for screening mammogram for malignant neoplasm of breast: Secondary | ICD-10-CM | POA: Diagnosis not present

## 2023-12-09 DIAGNOSIS — Z0000 Encounter for general adult medical examination without abnormal findings: Secondary | ICD-10-CM | POA: Diagnosis not present

## 2023-12-09 DIAGNOSIS — Z13220 Encounter for screening for lipoid disorders: Secondary | ICD-10-CM | POA: Diagnosis not present

## 2023-12-14 DIAGNOSIS — Z0000 Encounter for general adult medical examination without abnormal findings: Secondary | ICD-10-CM | POA: Diagnosis not present

## 2023-12-22 DIAGNOSIS — E663 Overweight: Secondary | ICD-10-CM | POA: Diagnosis not present

## 2023-12-22 DIAGNOSIS — Z809 Family history of malignant neoplasm, unspecified: Secondary | ICD-10-CM | POA: Diagnosis not present

## 2023-12-22 DIAGNOSIS — Z713 Dietary counseling and surveillance: Secondary | ICD-10-CM | POA: Diagnosis not present

## 2024-01-26 DIAGNOSIS — E669 Obesity, unspecified: Secondary | ICD-10-CM | POA: Diagnosis not present

## 2024-01-26 DIAGNOSIS — N926 Irregular menstruation, unspecified: Secondary | ICD-10-CM | POA: Diagnosis not present

## 2024-01-26 DIAGNOSIS — Z8742 Personal history of other diseases of the female genital tract: Secondary | ICD-10-CM | POA: Diagnosis not present

## 2024-02-28 DIAGNOSIS — N926 Irregular menstruation, unspecified: Secondary | ICD-10-CM | POA: Diagnosis not present

## 2024-02-28 DIAGNOSIS — E669 Obesity, unspecified: Secondary | ICD-10-CM | POA: Diagnosis not present
